# Patient Record
Sex: Male | Born: 1955 | Race: Black or African American | Hispanic: No | Marital: Married | Smoking: Never smoker
Health system: Southern US, Community
[De-identification: ages and names within clinical notes are randomized; demographics above are authoritative.]

## PROBLEM LIST (undated history)

## (undated) DIAGNOSIS — I219 Acute myocardial infarction, unspecified: Secondary | ICD-10-CM

## (undated) DIAGNOSIS — R7401 Elevation of levels of liver transaminase levels: Secondary | ICD-10-CM

## (undated) DIAGNOSIS — H409 Unspecified glaucoma: Secondary | ICD-10-CM

## (undated) DIAGNOSIS — R740 Nonspecific elevation of levels of transaminase and lactic acid dehydrogenase [LDH]: Secondary | ICD-10-CM

## (undated) HISTORY — PX: CHOLECYSTECTOMY: SHX55

## (undated) HISTORY — DX: Nonspecific elevation of levels of transaminase and lactic acid dehydrogenase (ldh): R74.0

## (undated) HISTORY — DX: Elevation of levels of liver transaminase levels: R74.01

---

## 2006-02-03 ENCOUNTER — Ambulatory Visit (HOSPITAL_COMMUNITY): Admission: RE | Admit: 2006-02-03 | Discharge: 2006-02-04 | Payer: Self-pay | Admitting: Neurosurgery

## 2010-05-05 ENCOUNTER — Ambulatory Visit: Payer: Self-pay | Admitting: Diagnostic Radiology

## 2010-05-05 ENCOUNTER — Inpatient Hospital Stay (HOSPITAL_COMMUNITY): Admission: AD | Admit: 2010-05-05 | Discharge: 2010-05-09 | Payer: Self-pay | Admitting: Internal Medicine

## 2010-05-05 ENCOUNTER — Encounter: Payer: Self-pay | Admitting: Emergency Medicine

## 2011-01-14 IMAGING — CT CT ABDOMEN W/ CM
2 of 4 series · 17 of 46 positions shown, 19 images · IV contrast (agent unspecified)
Comparison: None.

CLINICAL DATA: Vomiting.  Anorexia.  Fever.  Elevated liver
function tests.

CT ABDOMEN WITH CONTRAST
TECHNIQUE: Multidetector CT imaging of the abdomen was performed
using the standard protocol following bolus administration of
intravenous contrast.
Contrast: 100 ml Mmnipaque-YEE and oral contrast

[Series 2: rtn a/p with · axial · 0.69mm/px · z∈[+978,+1258]mm · 14 of 62 slices shown, 16 images]
[im 3/62  soft-tissue]
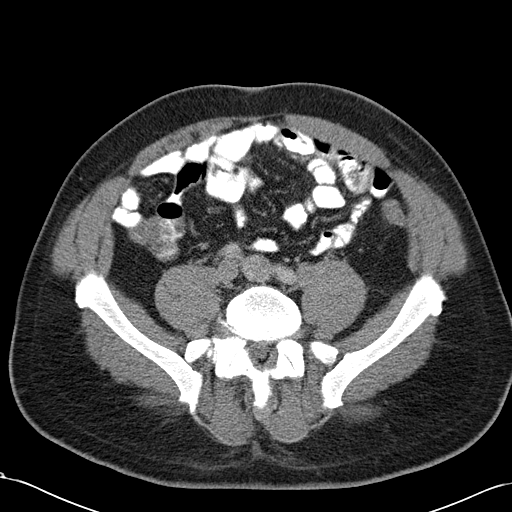
[im 3/62  bone]
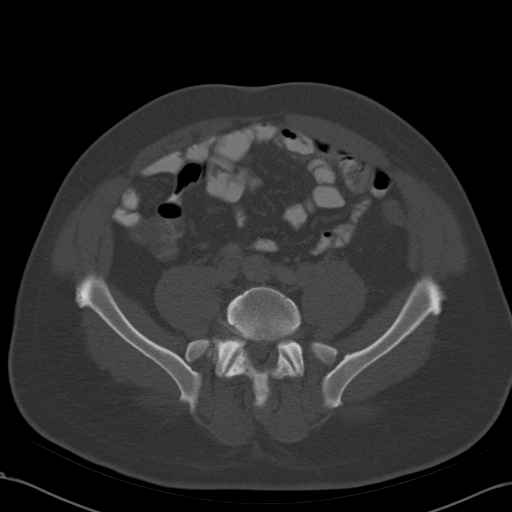
[im 9/62  soft-tissue]
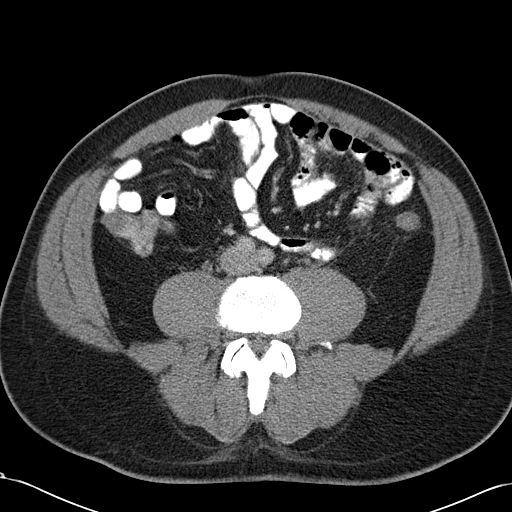
[im 12/62  soft-tissue]
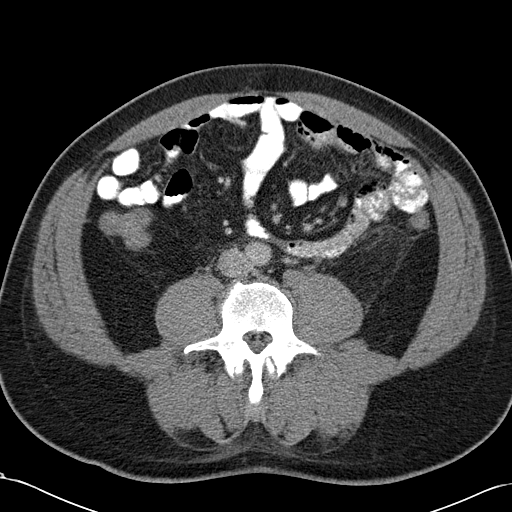
[im 18/62  soft-tissue]
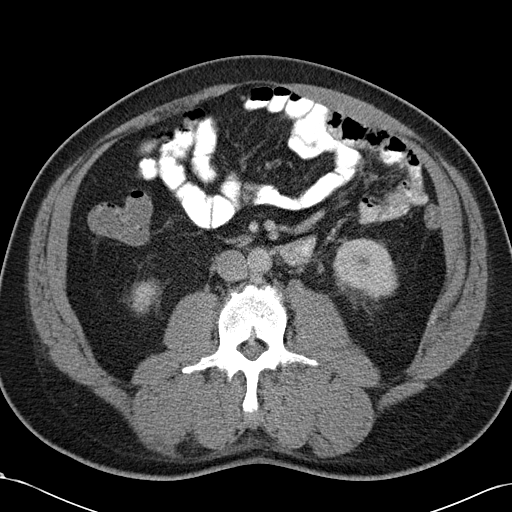
[im 21/62  soft-tissue]
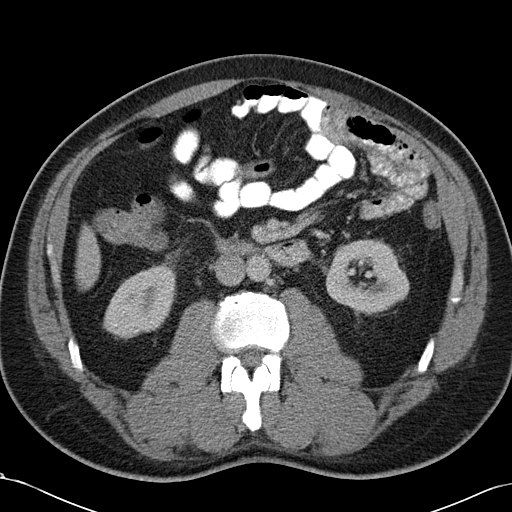
[im 24/62  soft-tissue]
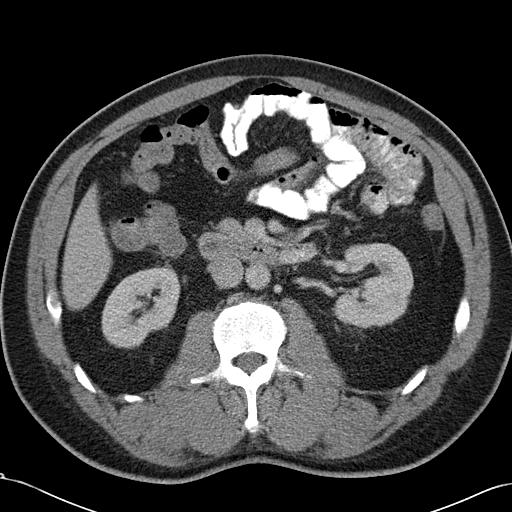
[im 30/62  soft-tissue]
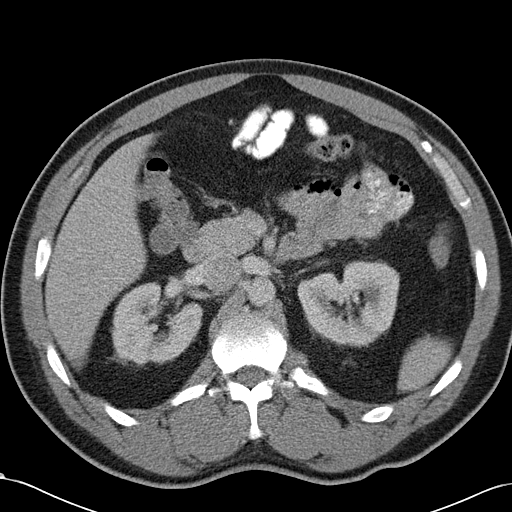
[im 32/62  soft-tissue]
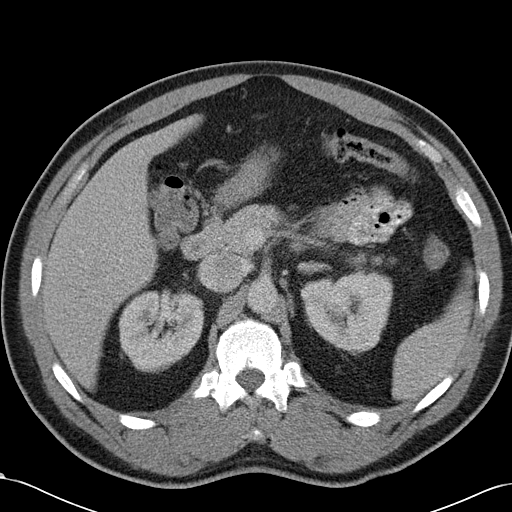
[im 38/62  soft-tissue]
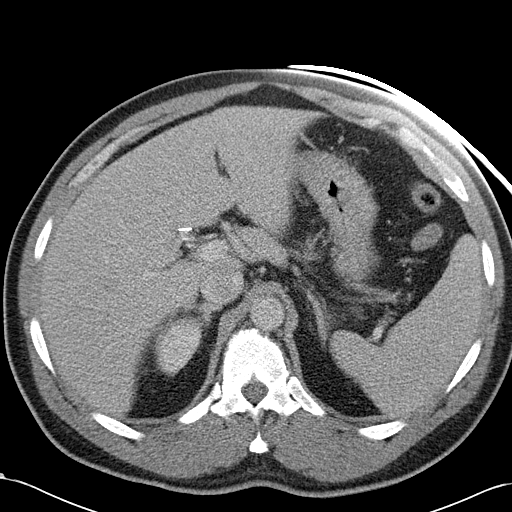
[im 38/62  bone]
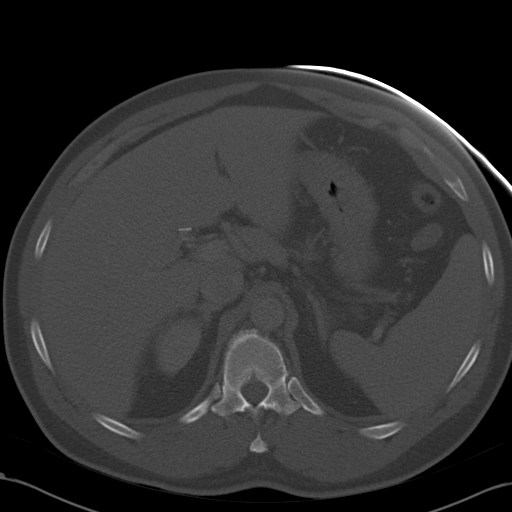
[im 41/62  soft-tissue]
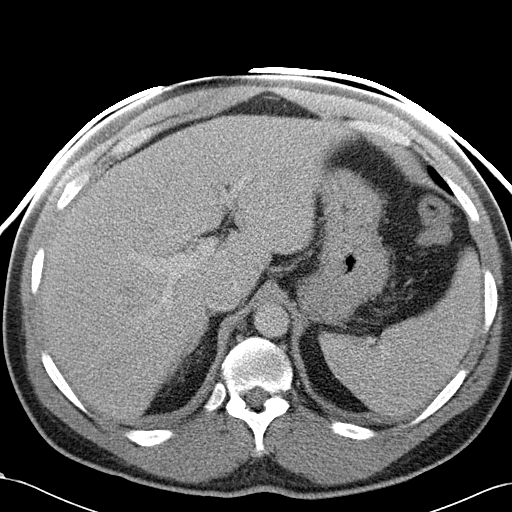
[im 47/62  soft-tissue]
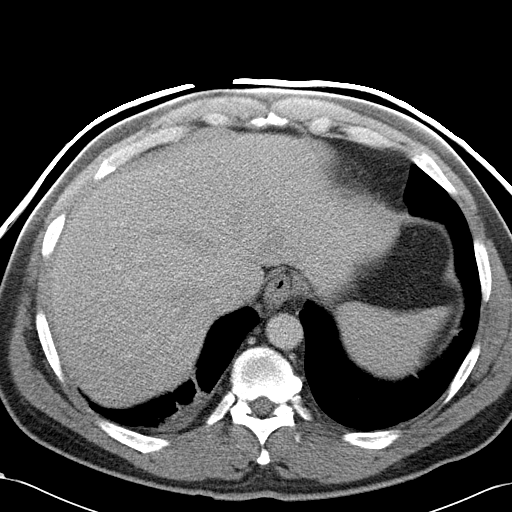
[im 50/62  soft-tissue]
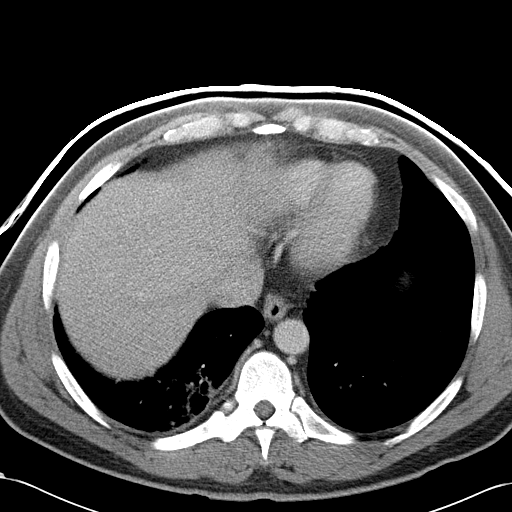
[im 53/62  soft-tissue]
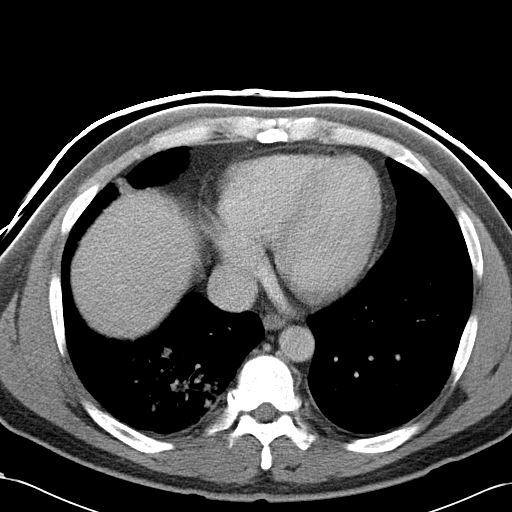
[im 59/62  soft-tissue]
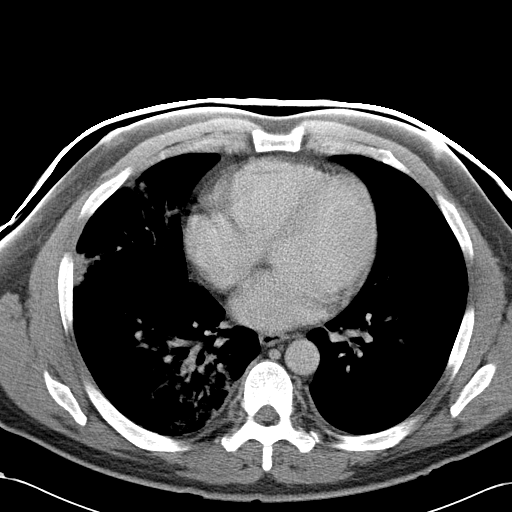

[Series 602: <mpr thick range> · coronal · 0.69mm/px · 3 of 98 slices shown]
[im 33/98  soft-tissue]
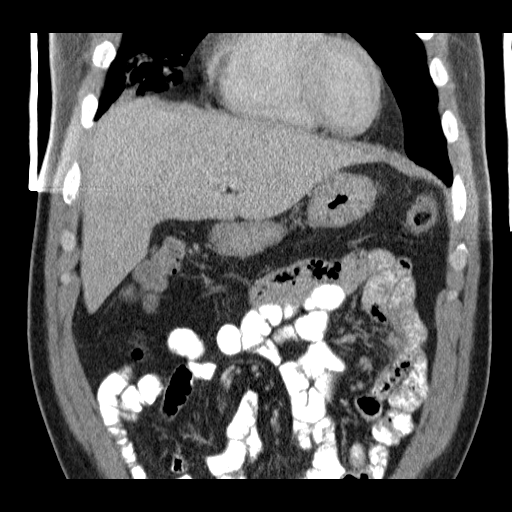
[im 44/98  soft-tissue]
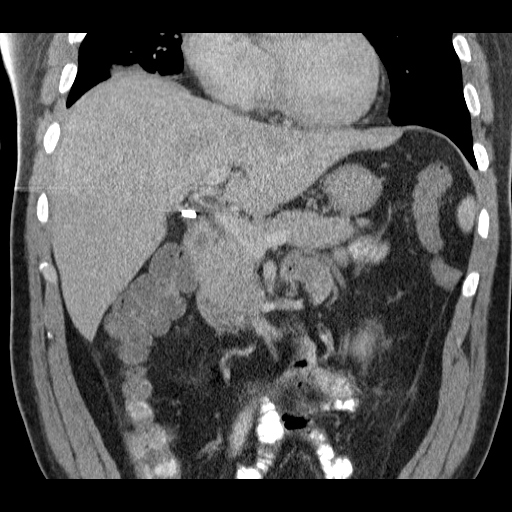
[im 54/98  soft-tissue]
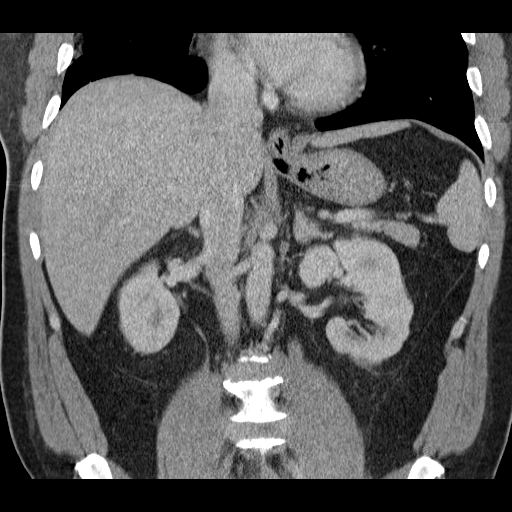

[17 of 46 positions shown; findings below may reference images not displayed]

FINDINGS: Pulmonary air space disease is seen in the visualized
portions of the right middle and lower lobes, consistent with
pneumonia.

Surgical clips seen from prior cholecystectomy.  No evidence of
liver mass or biliary ductal dilatation.  The pancreas, spleen,
adrenal glands, and kidneys are normal appearance.  There is no
evidence of hydronephrosis.

No soft tissue masses or lymphadenopathy are identified within the
abdomen.  There is no evidence of inflammatory process or abnormal
fluid collections.  Visualized abdominal bowel loops appear normal.
IMPRESSION: 1.  Right middle and lower lobe airspace disease, consistent with
pneumonia.
2.  No acute findings or significant abnormality within the
abdomen.

## 2011-02-09 LAB — COMPREHENSIVE METABOLIC PANEL
Alkaline Phosphatase: 286 U/L — ABNORMAL HIGH (ref 39–117)
Chloride: 108 mEq/L (ref 96–112)
GFR calc Af Amer: >60 mL/min (ref >60)
GFR calc non Af Amer: >60 mL/min (ref >60)
Potassium: 3.7 mEq/L (ref 3.5–5.1)
Total Protein: 6.6 g/dL (ref 6.0–8.3)

## 2011-02-09 LAB — BASIC METABOLIC PANEL
BUN: 7 mg/dL (ref 6–23)
CO2: 23 mEq/L (ref 19–32)
Calcium: 8.7 mg/dL (ref 8.4–10.5)
Chloride: 110 mEq/L (ref 96–112)
Creatinine, Ser: 0.94 mg/dL (ref 0.4–1.5)
GFR calc non Af Amer: >60 mL/min (ref >60)
Potassium: 3.8 mEq/L (ref 3.5–5.1)
Sodium: 140 mEq/L (ref 135–145)

## 2011-02-09 LAB — HEPATITIS C ANTIBODY (REFLEX): HCV Ab: NEGATIVE

## 2011-02-09 LAB — DIFFERENTIAL
Basophils Relative: 0 % (ref 0–1)
Eosinophils Absolute: 0.1 10*3/uL (ref 0.0–0.7)
Lymphs Abs: 1.2 10*3/uL (ref 0.7–4.0)
Neutro Abs: 5 10*3/uL (ref 1.7–7.7)
Neutrophils Relative %: 75 % (ref 43–77)

## 2011-02-09 LAB — MAGNESIUM: Magnesium: 2.1 mg/dL (ref 1.5–2.5)

## 2011-02-09 LAB — CBC
MCHC: 34.7 g/dL (ref 30.0–36.0)
MCV: 87.1 fL (ref 78.0–100.0)
RBC: 3.63 MIL/uL — ABNORMAL LOW (ref 4.22–5.81)
RDW: 14 % (ref 11.5–15.5)
RDW: 14.7 % (ref 11.5–15.5)

## 2011-02-10 LAB — CULTURE, BLOOD (ROUTINE X 2)
Culture: NO GROWTH
Culture: NO GROWTH

## 2011-02-10 LAB — CBC
HCT: 34.5 % — ABNORMAL LOW (ref 39.0–52.0)
HCT: 35.3 % — ABNORMAL LOW (ref 39.0–52.0)
Hemoglobin: 11.4 g/dL — ABNORMAL LOW (ref 13.0–17.0)
Hemoglobin: 12 g/dL — ABNORMAL LOW (ref 13.0–17.0)
Hemoglobin: 13.1 g/dL (ref 13.0–17.0)
MCV: 90.6 fL (ref 78.0–100.0)
Platelets: 209 10*3/uL (ref 150–400)
Platelets: 209 10*3/uL (ref 150–400)
RBC: 3.81 MIL/uL — ABNORMAL LOW (ref 4.22–5.81)
RDW: 13.6 % (ref 11.5–15.5)
RDW: 14.7 % (ref 11.5–15.5)
WBC: 3.6 10*3/uL — ABNORMAL LOW (ref 4.0–10.5)
WBC: 7.9 10*3/uL (ref 4.0–10.5)

## 2011-02-10 LAB — COMPREHENSIVE METABOLIC PANEL
ALT: 110 U/L — ABNORMAL HIGH (ref 0–53)
Albumin: 2.4 g/dL — ABNORMAL LOW (ref 3.5–5.2)
Albumin: 2.4 g/dL — ABNORMAL LOW (ref 3.5–5.2)
Alkaline Phosphatase: 246 U/L — ABNORMAL HIGH (ref 39–117)
Alkaline Phosphatase: 281 U/L — ABNORMAL HIGH (ref 39–117)
BUN: 12 mg/dL (ref 6–23)
BUN: 28 mg/dL — ABNORMAL HIGH (ref 6–23)
BUN: 37 mg/dL — ABNORMAL HIGH (ref 6–23)
CO2: 20 mEq/L (ref 19–32)
CO2: 24 mEq/L (ref 19–32)
Chloride: 104 mEq/L (ref 96–112)
Chloride: 110 mEq/L (ref 96–112)
Creatinine, Ser: 0.92 mg/dL (ref 0.4–1.5)
Creatinine, Ser: 2.1 mg/dL — ABNORMAL HIGH (ref 0.4–1.5)
GFR calc non Af Amer: 33 mL/min — ABNORMAL LOW (ref >60)
GFR calc non Af Amer: >60 mL/min (ref >60)
Glucose, Bld: 131 mg/dL — ABNORMAL HIGH (ref 70–99)
Potassium: 3.5 mEq/L (ref 3.5–5.1)
Potassium: 3.6 mEq/L (ref 3.5–5.1)
Potassium: 3.9 mEq/L (ref 3.5–5.1)
Sodium: 136 mEq/L (ref 135–145)
Sodium: 138 mEq/L (ref 135–145)
Total Bilirubin: 0.7 mg/dL (ref 0.3–1.2)
Total Bilirubin: 1.7 mg/dL — ABNORMAL HIGH (ref 0.3–1.2)
Total Protein: 7.3 g/dL (ref 6.0–8.3)

## 2011-02-10 LAB — LEGIONELLA ANTIGEN, URINE

## 2011-02-10 LAB — DIFFERENTIAL
Basophils Relative: 0 % (ref 0–1)
Eosinophils Relative: 0 % (ref 0–5)
Lymphocytes Relative: 8 % — ABNORMAL LOW (ref 12–46)
Lymphs Abs: 0.8 10*3/uL (ref 0.7–4.0)
Monocytes Absolute: 1.3 10*3/uL — ABNORMAL HIGH (ref 0.1–1.0)
Neutro Abs: 7.5 10*3/uL (ref 1.7–7.7)

## 2011-02-10 LAB — RAPID URINE DRUG SCREEN, HOSP PERFORMED
Amphetamines: NOT DETECTED
Cocaine: NOT DETECTED
Opiates: POSITIVE — AB
Tetrahydrocannabinol: NOT DETECTED

## 2011-02-10 LAB — URINALYSIS, ROUTINE W REFLEX MICROSCOPIC
Glucose, UA: NEGATIVE mg/dL
Ketones, ur: NEGATIVE mg/dL
Specific Gravity, Urine: 1.017 (ref 1.005–1.030)

## 2011-02-10 LAB — URINE CULTURE
Colony Count: NO GROWTH
Culture: NO GROWTH

## 2011-02-10 LAB — URINE MICROSCOPIC-ADD ON

## 2011-02-10 LAB — MAGNESIUM: Magnesium: 2.6 mg/dL — ABNORMAL HIGH (ref 1.5–2.5)

## 2011-04-11 NOTE — Op Note (Signed)
NAME:  Nathan Mccormick, Nathan Mccormick               ACCOUNT NO.:  [id number]   MEDICAL RECORD NO.:  [id number]          PATIENT TYPE:  AMB   LOCATION:  SDS                          FACILITY:  [facility]   PHYSICIAN:  Reinaldo Meeker, M.D. DATE OF BIRTH:  1956-09-16   DATE OF PROCEDURE:  02/03/2006  DATE OF DISCHARGE:                                 OPERATIVE REPORT   PREOPERATIVE DIAGNOSIS:  Herniated disk L5-S1, right.   POSTOPERATIVE DIAGNOSIS:  Herniated disk L5-S1, right.   PROCEDURE PERFORMED:  Right L5-S1 intralaminar laminotomy for excision of  herniated disk with operating microscope.   SECONDARY PROCEDURE:  Microdissection L5-S1 disk and S1 nerve root.   SURGEON:  Reinaldo Meeker, M.D.   ASSISTANT:  Kathaleen Maser. Pool, M.D.   ANESTHESIA:  General.   DESCRIPTION OF PROCEDURE:  After being placed in prone position, the  patient's back was prepped and draped in the usual sterile fashion.  Localizing x-ray was taken prior to incision to identify the appropriate  level.  Midline incision was made above the spinous processes of L5 and S1.  Using the Bovie cutting current, the incision was carried down to spinous  processes.  Subperiosteal dissection was then carried out on the right-sided  spinous processes and lamina.  Self-retaining retractor was placed for  exposure.  X-ray showed approach to the appropriate level.  Using the high  speed drill, the inferior one third of the L5 lamina and the medial one  third of the facet joint were removed.  A drill was then used to remove the  superior one half of the S1 lamina.  The residual bone and ligamentum flavum  were removed in piecemeal fashion.  The operating microscope was draped and  brought into the field and used for the remainder of the case.  Using  microdissection technique,  the lateral aspect of the thecal sac and S1  nerve were identified.  Further coagulation was carried out down to the  floor of the canal to identify the L5-S1 disk which  was found to be markedly  herniated.  After coagulating out the annulus, the annulus was incised with  a 15 blade.  Using pituitary rongeurs and curets, a thorough disk space  clean out was carried out.  Inspection inferior to the disk space yielded a  large subligamentous fragment of disk and this was removed in piecemeal  fashion until the nerve root was well decompressed.  Additional disk space  cleanout was then carried out at this time.  Inspection was then carried out  in all directions for any evidence of residual compression and none could be  identified.  Large amounts of irrigation were carried out.  Any bleeding was  controlled with bipolar coagulation and Gelfoam.  The  wound was then closed in multiple layers of Vicryl in the muscle, fascia,  subcutaneous and subcuticular tissues and staples were placed on the skin.  Sterile dressing was then applied and the patient was extubated and taken to  recovery room in stable condition.           ______________________________  Harvie Heck  Sonda Primes, M.D.     ROK/MEDQ  D:  02/03/2006  T:  02/04/2006  Job:  [id number]

## 2014-10-16 ENCOUNTER — Ambulatory Visit (INDEPENDENT_AMBULATORY_CARE_PROVIDER_SITE_OTHER): Payer: BC Managed Care – PPO | Admitting: Physician Assistant

## 2014-10-16 VITALS — BP 124/70 | HR 65 | Temp 97.5°F | Resp 18 | Ht 70.0 in | Wt 226.0 lb

## 2014-10-16 DIAGNOSIS — L918 Other hypertrophic disorders of the skin: Secondary | ICD-10-CM

## 2014-10-16 DIAGNOSIS — Z7251 High risk heterosexual behavior: Secondary | ICD-10-CM

## 2014-10-16 MED ORDER — IBUPROFEN 800 MG PO TABS: 800.0000 mg | ORAL_TABLET | Freq: Three times a day (TID) | ORAL | Status: DC | PRN

## 2014-10-16 NOTE — Patient Instructions (Addendum)
Take Ibuprofen 800mg  with food once every 8 hours for pain. Use Neosporin over-the-counter for the next 5 days.  Biopsy Care After Refer to this sheet in the next few weeks. These instructions provide you with information on caring for yourself after your procedure. Your caregiver may also give you more specific instructions. Your treatment has been planned according to current medical practices, but problems sometimes occur. Call your caregiver if you have any problems or questions after your procedure. If you had a fine needle biopsy, you may have soreness at the biopsy site for 1 to 2 days. If you had an open biopsy, you may have soreness at the biopsy site for 3 to 4 days. HOME CARE INSTRUCTIONS   You may resume normal diet and activities as directed.  Change bandages (dressings) as directed. If your wound was closed with a skin glue (adhesive), it will wear off and begin to peel in 7 days.  Only take over-the-counter or prescription medicines for pain, discomfort, or fever as directed by your caregiver.  Ask your caregiver when you can bathe and get your wound wet. SEEK IMMEDIATE MEDICAL CARE IF:   You have increased bleeding (more than a small spot) from the biopsy site.  You notice redness, swelling, or increasing pain at the biopsy site.  You have pus coming from the biopsy site.  You have a fever.  You notice a bad smell coming from the biopsy site or dressing.  You have a rash, have difficulty breathing, or have any allergic problems. MAKE SURE YOU:   Understand these instructions.  Will watch your condition.  Will get help right away if you are not doing well or get worse. Document Released: 05/30/2005 Document Revised: 02/02/2012 Document Reviewed: 05/08/2011 [facility] Patient Information 2015 [employer] [place], [place]. This information is not intended to replace advice given to you by your health care provider. Make sure you discuss any questions you have with your health care  provider.

## 2014-10-16 NOTE — Progress Notes (Signed)
    MRN: [id number] DOB: [birth year]  Subjective:   Nathan Mccormick is a 58 y.o. male presenting for STI check and skin tag removal.  STI - patient had extramarital sex four days ago and condom broke. Has not had sex with his wife since. Currently denies penile lesions or pain, discharge, bleeding, swelling, rashes, dysuria, hematuria, fevers, n/v, abdominal pain. States that he regularly has extramarital affairs but always uses condom, since the condom broke this past time, he would like to have STI check before having sex with his wife.  Skin tag - reports that he has had a growth on torso just below his left armpit. Cannot recall how long it has been there but believes it's been more than 1 year. Denies pain, edema, erythema, discharge, bleeding. Denies history of skin cancer.   Denies any other aggravating or relieving factors, no other questions or concerns.  No Known Allergies  ROS As in subjective.   Objective:   Vitals: BP 124/70 mmHg  Pulse 65  Temp(Src) 97.5 F (36.4 C) (Oral)  Resp 18  Ht 5\' 10"  (1.778 m)  Wt 226 lb (102.513 kg)  BMI 32.43 kg/m2  SpO2 97%  Physical Exam  Constitutional: He is oriented to person, place, and time and well-developed, well-nourished, and in no distress. No distress.  Cardiovascular: Normal rate.   Pulmonary/Chest: Effort normal.  Neurological: He is alert and oriented to person, place, and time.  Skin: Skin is warm and dry. Lesion (skin tag with narrow stalk ~1.5 in diameter left lateral torso just inferior to axilla, no edema, erythema, drainage) noted. No rash noted. He is not diaphoretic. No erythema.   Verbal consent obtain from the patient. Skin cleansed with alcohol pad, then local anesthesia with 1cc 2% lidocaine with epinephrine.  1cm shave of skin tag performed. Specimen was not sent for pathology review.  Hemostasis achieved with drysol and pressure. Small dressing applied.  Local wound care reviewed.  Assessment and Plan :    1. High risk sexual behavior - Advised not have sexual intercourse again until lab results. Will call to report NOT mail results due to patient's request for privacy. - RPR - HIV antibody - GC/Chlamydia Probe Amp  2. Skin tag - Counseled on wound care - Return to clinic if symptoms worsen, fail to resolve or as needed   Wallis BambergMario Lacole Komorowski, PA-C [facility] [phone] 10/16/2014 2:49 PM

## 2014-10-16 NOTE — Progress Notes (Signed)
I have discussed this case with Mr. Mani, PA-C, directly supervised the procedure and agree.  

## 2014-10-17 LAB — HIV ANTIBODY (ROUTINE TESTING W REFLEX): HIV: NONREACTIVE

## 2014-10-17 LAB — GC/CHLAMYDIA PROBE AMP
CT PROBE, AMP APTIMA: NEGATIVE
GC Probe RNA: NEGATIVE

## 2014-10-17 LAB — RPR

## 2014-10-20 ENCOUNTER — Telehealth: Payer: Self-pay | Admitting: Urgent Care

## 2014-10-20 NOTE — Telephone Encounter (Signed)
Left message for patient requesting call back to discuss results and follow up.  Wallis BambergMario Luceil Herrin, PA-C [facility] [phone] 10/20/2014  10:18 AM

## 2014-10-21 ENCOUNTER — Telehealth: Payer: Self-pay | Admitting: Radiology

## 2014-10-21 NOTE — Telephone Encounter (Signed)
Called patient back. Left message requesting call back and asked if he could please let staff know whether or not I could leave him a more detailed message including his results. We cannot mail results d/t patient request for privacy so please let him know that he could either provide a time that he could be reached or return to clinic to pick up results.  Wallis BambergMario Salia Cangemi, PA-C [facility] [phone] 10/21/2014  4:41 PM

## 2014-10-21 NOTE — Telephone Encounter (Signed)
Pt returning call saying you called him.

## 2014-10-22 NOTE — Telephone Encounter (Signed)
Called patient again to discuss results since patient requested no mailing. Please let patient know that [facility]. I will try calling him again on Tuesday-Thursday. If he calls back before then, please ask patient to let us know if I can leave his results in his voicemail or if he has a reliable time when I could reach him.  Wallis BambergMario Massiah Longanecker, PA-C [facility] [phone] 10/22/2014  4:46 PM

## 2014-10-23 NOTE — Telephone Encounter (Signed)
Pt called and stated that you can call him on Tuesday anytime.

## 2014-10-24 ENCOUNTER — Encounter: Payer: Self-pay | Admitting: Urgent Care

## 2014-10-24 NOTE — Telephone Encounter (Signed)
Called patient, reviewed results. Patient requested that I send results via mail now despite not wanting them for his privacy initially. Advised patient to schedule physical exam or come to walk in clinic.  Wallis BambergMario Rube Sanchez, PA-C [facility] [phone] 10/24/2014  9:26 AM

## 2014-10-24 NOTE — Telephone Encounter (Signed)
Pt CB to see if he could reach [place] when he first gets in bc he is home from work and will be awake until about 11 am. He reported that Kathlene NovemberMike has been trying to reach him himself and he can call him at [phone]. Kathlene NovemberMike I did see that you had tried to call pt yourself several times, but if you need for me to tell him something I will be happy to.

## 2015-05-21 ENCOUNTER — Other Ambulatory Visit: Payer: Self-pay | Admitting: Urgent Care

## 2019-03-18 ENCOUNTER — Other Ambulatory Visit: Payer: Self-pay

## 2019-03-18 ENCOUNTER — Emergency Department (HOSPITAL_BASED_OUTPATIENT_CLINIC_OR_DEPARTMENT_OTHER)
Admission: EM | Admit: 2019-03-18 | Discharge: 2019-03-19 | Disposition: A | Discharge status: Home / Self Care | Payer: BLUE CROSS/BLUE SHIELD | Attending: Emergency Medicine | Admitting: Emergency Medicine

## 2019-03-18 ENCOUNTER — Encounter (HOSPITAL_BASED_OUTPATIENT_CLINIC_OR_DEPARTMENT_OTHER): Payer: Self-pay | Admitting: Emergency Medicine

## 2019-03-18 DIAGNOSIS — K529 Noninfective gastroenteritis and colitis, unspecified: Secondary | ICD-10-CM | POA: Insufficient documentation

## 2019-03-18 DIAGNOSIS — R1084 Generalized abdominal pain: Secondary | ICD-10-CM | POA: Diagnosis present

## 2019-03-18 HISTORY — DX: Acute myocardial infarction, unspecified: I21.9

## 2019-03-18 HISTORY — DX: Unspecified glaucoma: H40.9

## 2019-03-18 LAB — COMPREHENSIVE METABOLIC PANEL
ALT: 32 U/L (ref 0–44)
AST: 25 U/L (ref 15–41)
Albumin: 4.5 g/dL (ref 3.5–5.0)
Alkaline Phosphatase: 95 U/L (ref 38–126)
Anion gap: 10 (ref 5–15)
BUN: 12 mg/dL (ref 8–23)
CO2: 22 mmol/L (ref 22–32)
Calcium: 9.7 mg/dL (ref 8.9–10.3)
Chloride: 107 mmol/L (ref 98–111)
Creatinine, Ser: 1.18 mg/dL (ref 0.61–1.24)
GFR calc Af Amer: >60 mL/min (ref >60)
GFR calc non Af Amer: >60 mL/min (ref >60)
Glucose, Bld: 135 mg/dL — ABNORMAL HIGH (ref 70–99)
Potassium: 4.1 mmol/L (ref 3.5–5.1)
Sodium: 139 mmol/L (ref 135–145)
Total Bilirubin: 1.3 mg/dL — ABNORMAL HIGH (ref 0.3–1.2)
Total Protein: 8.4 g/dL — ABNORMAL HIGH (ref 6.5–8.1)

## 2019-03-18 LAB — LIPASE, BLOOD: Lipase: 29 U/L (ref 11–51)

## 2019-03-18 LAB — CBC WITH DIFFERENTIAL/PLATELET
Abs Immature Granulocytes: 0.05 10*3/uL (ref 0.00–0.07)
Basophils Absolute: 0 10*3/uL (ref 0.0–0.1)
Basophils Relative: 0 %
Eosinophils Absolute: 0.1 10*3/uL (ref 0.0–0.5)
Eosinophils Relative: 1 %
HCT: 42 % (ref 39.0–52.0)
Hemoglobin: 13.8 g/dL (ref 13.0–17.0)
Immature Granulocytes: 1 %
Lymphocytes Relative: 18 %
Lymphs Abs: 1.6 10*3/uL (ref 0.7–4.0)
MCH: 30 pg (ref 26.0–34.0)
MCHC: 32.9 g/dL (ref 30.0–36.0)
MCV: 91.3 fL (ref 80.0–100.0)
Monocytes Absolute: 0.5 10*3/uL (ref 0.1–1.0)
Monocytes Relative: 5 %
Neutro Abs: 6.5 10*3/uL (ref 1.7–7.7)
Neutrophils Relative %: 75 %
Platelets: 219 10*3/uL (ref 150–400)
RBC: 4.6 MIL/uL (ref 4.22–5.81)
RDW: 13.2 % (ref 11.5–15.5)
WBC: 8.7 10*3/uL (ref 4.0–10.5)
nRBC: 0 % (ref 0.0–0.2)

## 2019-03-18 LAB — OCCULT BLOOD X 1 CARD TO LAB, STOOL: Fecal Occult Bld: NEGATIVE

## 2019-03-18 MED ORDER — ONDANSETRON HCL 4 MG/2ML IJ SOLN
4.0000 mg | Freq: Once | INTRAMUSCULAR | Status: AC | PRN
  Administered 2019-03-18: 4 mg via INTRAVENOUS

## 2019-03-18 MED ORDER — FENTANYL CITRATE (PF) 100 MCG/2ML IJ SOLN
100.0000 ug | Freq: Once | INTRAMUSCULAR | Status: AC
  Administered 2019-03-18: 100 ug via INTRAVENOUS
  Filled 2019-03-18: qty 2

## 2019-03-18 MED ORDER — SODIUM CHLORIDE 0.9 % IV BOLUS
1000.0000 mL | Freq: Once | INTRAVENOUS | Status: AC
Start: 2019-03-18 — End: 2019-03-19
  Administered 2019-03-18: 1000 mL via INTRAVENOUS

## 2019-03-18 MED ORDER — ONDANSETRON HCL 4 MG/2ML IJ SOLN
INTRAMUSCULAR | Status: AC
  Filled 2019-03-18: qty 2

## 2019-03-18 NOTE — ED Triage Notes (Signed)
Pt c/o abd pain with vomiting and diarrhea that started yesterday.

## 2019-03-18 NOTE — ED Notes (Signed)
ED Provider at bedside. 

## 2019-03-18 NOTE — ED Provider Notes (Signed)
[facility] Provider Note: [name] [name], MD, FACEP  CSN: [id number] MRN: [id number] ARRIVAL: 03/18/19 at 2305 ROOM: [facility]09/[facility]09   CHIEF COMPLAINT  Abdominal Pain   HISTORY OF PRESENT ILLNESS  03/18/19 11:17 PM Nathan LukesJames E Mccormick is a 63 y.o. male who complains of nausea, vomiting, diarrhea and abdominal pain since this morning.  He rates his abdominal pain is a 10 out of 10 and describes it as diffuse cramping.  He has not had a fever.  Pain is somewhat worse with movement or palpation.  He believes he is passing blood in his stool.  He is also complaining of pain over his left greater trochanter which he attributes to a bad mattress.   Past Medical History:  Diagnosis Date   Glaucoma    MI (myocardial infarction) (HCC)    Transaminitis     Past Surgical History:  Procedure Laterality Date   CHOLECYSTECTOMY      No family history on file.  Social History   Tobacco Use   Smoking status: Never Smoker   Smokeless tobacco: Never Used  Substance Use Topics   Alcohol use: No    Alcohol/week: 0.0 standard drinks   Drug use: No    Prior to Admission medications   Not on File    Allergies Patient has no known allergies.   REVIEW OF SYSTEMS  Negative except as noted here or in the History of Present Illness.   PHYSICAL EXAMINATION  Initial Vital Signs Blood pressure (!) 157/83, pulse (!) 57, temperature 97.6 F (36.4 C), temperature source Oral, resp. rate 20, height 5\' 9"  (1.753 m), weight 99.8 kg, SpO2 100 %.  Examination General: Well-developed, well-nourished male in no acute distress; appearance consistent with age of record HENT: normocephalic; atraumatic Eyes: pupils equal, round and reactive to light; extraocular muscles intact; arcus senilis bilaterally Neck: supple Heart: regular rate and rhythm Lungs: clear to auscultation bilaterally Abdomen: soft; nondistended; diffuse tenderness most prominent in the left upper quadrant; no  masses or hepatosplenomegaly; bowel sounds present Rectal: Normal sphincter tone; no stool or blood on examining glove; sample sent for Hemoccult Extremities: No deformity; full range of motion; pulses normal Neurologic: Awake, alert and oriented; motor function intact in all extremities and symmetric; no facial droop Skin: Warm and dry Psychiatric: Flat affect; moaning   RESULTS  Summary of this visit's results, reviewed by myself:   EKG Interpretation  Date/Time:    Ventricular Rate:    PR Interval:    QRS Duration:   QT Interval:    QTC Calculation:   R Axis:     Text Interpretation:        Laboratory Studies: Results for orders placed or performed during the hospital encounter of 03/18/19 (from the past 24 hour(s))  Lipase, blood     Status: None   Collection Time: 03/18/19 11:21 PM  Result Value Ref Range   Lipase 29 11 - 51 U/L  Comprehensive metabolic panel     Status: Abnormal   Collection Time: 03/18/19 11:21 PM  Result Value Ref Range   Sodium 139 135 - 145 mmol/L   Potassium 4.1 3.5 - 5.1 mmol/L   Chloride 107 98 - 111 mmol/L   CO2 22 22 - 32 mmol/L   Glucose, Bld 135 (H) 70 - 99 mg/dL   BUN 12 8 - 23 mg/dL   Creatinine, Ser 0.341.18 0.61 - 1.24 mg/dL   Calcium 9.7 8.9 - 74.210.3 mg/dL   Total Protein 8.4 (H) 6.5 -  8.1 g/dL   Albumin 4.5 3.5 - 5.0 g/dL   AST 25 15 - 41 U/L   ALT 32 0 - 44 U/L   Alkaline Phosphatase 95 38 - 126 U/L   Total Bilirubin 1.3 (H) 0.3 - 1.2 mg/dL   GFR calc non Af Amer >60 >60 mL/min   GFR calc Af Amer >60 >60 mL/min   Anion gap 10 5 - 15  CBC with Differential     Status: None   Collection Time: 03/18/19 11:21 PM  Result Value Ref Range   WBC 8.7 4.0 - 10.5 K/uL   RBC 4.60 4.22 - 5.81 MIL/uL   Hemoglobin 13.8 13.0 - 17.0 g/dL   HCT 82.9 56.2 - 13.0 %   MCV 91.3 80.0 - 100.0 fL   MCH 30.0 26.0 - 34.0 pg   MCHC 32.9 30.0 - 36.0 g/dL   RDW 86.5 78.4 - 69.6 %   Platelets 219 150 - 400 K/uL   nRBC 0.0 0.0 - 0.2 %   Neutrophils  Relative % 75 %   Neutro Abs 6.5 1.7 - 7.7 K/uL   Lymphocytes Relative 18 %   Lymphs Abs 1.6 0.7 - 4.0 K/uL   Monocytes Relative 5 %   Monocytes Absolute 0.5 0.1 - 1.0 K/uL   Eosinophils Relative 1 %   Eosinophils Absolute 0.1 0.0 - 0.5 K/uL   Basophils Relative 0 %   Basophils Absolute 0.0 0.0 - 0.1 K/uL   Immature Granulocytes 1 %   Abs Immature Granulocytes 0.05 0.00 - 0.07 K/uL  Occult blood card to lab, stool     Status: None   Collection Time: 03/18/19 11:29 PM  Result Value Ref Range   Fecal Occult Bld NEGATIVE NEGATIVE  Urinalysis, Routine w reflex microscopic     Status: Abnormal   Collection Time: 03/19/19 12:48 AM  Result Value Ref Range   Color, Urine YELLOW YELLOW   APPearance CLEAR CLEAR   Specific Gravity, Urine <1.005 (L) 1.005 - 1.030   pH 6.0 5.0 - 8.0   Glucose, UA NEGATIVE NEGATIVE mg/dL   Hgb urine dipstick NEGATIVE NEGATIVE   Bilirubin Urine NEGATIVE NEGATIVE   Ketones, ur NEGATIVE NEGATIVE mg/dL   Protein, ur NEGATIVE NEGATIVE mg/dL   Nitrite NEGATIVE NEGATIVE   Leukocytes,Ua NEGATIVE NEGATIVE   Imaging Studies: Ct Abdomen Pelvis W Contrast  Result Date: 03/19/2019 CLINICAL DATA:  Abdominal pain, vomiting EXAM: CT ABDOMEN AND PELVIS WITH CONTRAST TECHNIQUE: Multidetector CT imaging of the abdomen and pelvis was performed using the standard protocol following bolus administration of intravenous contrast. CONTRAST:  OMNIPAQUE IOHEXOL 300 MG/ML  SOLN COMPARISON:  05/07/2010 FINDINGS: Lower chest: Bibasilar dependent atelectasis.  No acute abnormality. Hepatobiliary: No focal liver abnormality is seen. Status post cholecystectomy. No biliary dilatation. Pancreas: No focal abnormality or ductal dilatation. Spleen: No focal abnormality.  Normal size. Adrenals/Urinary Tract: No adrenal abnormality. No focal renal abnormality. No stones or hydronephrosis. Urinary bladder is unremarkable. Stomach/Bowel: Stomach, large and small bowel grossly unremarkable.  Appendix not definitively seen. No inflammatory process in the right lower quadrant. Vascular/Lymphatic: No evidence of aneurysm or adenopathy. Reproductive: No visible focal abnormality. Other: No free fluid or free air. Left inguinal hernia containing fat. Musculoskeletal: Degenerative changes in the lumbar spine. No acute bony abnormality. IMPRESSION: No acute findings in the abdomen or pelvis. Left inguinal hernia containing fat. Electronically Signed   By: Charlett Nose M.D.   On: 03/19/2019 00:25    ED COURSE and MDM  Nursing notes and initial vitals signs, including pulse oximetry, reviewed.  Vitals:   03/18/19 2312 03/18/19 2313  BP:  (!) 157/83  Pulse:  (!) 57  Resp:  20  Temp:  97.6 F (36.4 C)  TempSrc:  Oral  SpO2:  100%  Weight: 99.8 kg   Height: 5\' 9"  (1.753 m)    1:12 AM Patient feeling better after IV fluids and medication.  Abdomen soft and nondistended.  CT scan and laboratory studies reassuring.  Symptoms are consistent with gastroenteritis.  We will treat with Zofran and Bentyl.   PROCEDURES    ED DIAGNOSES     ICD-10-CM   1. Gastroenteritis K52.9        Haidy Kackley, MD 03/19/19 [phone]

## 2019-03-19 ENCOUNTER — Emergency Department (HOSPITAL_BASED_OUTPATIENT_CLINIC_OR_DEPARTMENT_OTHER): Payer: BLUE CROSS/BLUE SHIELD

## 2019-03-19 LAB — URINALYSIS, ROUTINE W REFLEX MICROSCOPIC
Bilirubin Urine: NEGATIVE
Glucose, UA: NEGATIVE mg/dL
Hgb urine dipstick: NEGATIVE
Ketones, ur: NEGATIVE mg/dL
Leukocytes,Ua: NEGATIVE
Nitrite: NEGATIVE
Protein, ur: NEGATIVE mg/dL
Specific Gravity, Urine: <1.005 — ABNORMAL LOW (ref 1.005–1.030)
pH: 6 (ref 5.0–8.0)

## 2019-03-19 MED ORDER — DICYCLOMINE HCL 10 MG/ML IM SOLN
20.0000 mg | Freq: Once | INTRAMUSCULAR | Status: AC
Start: 2019-03-19 — End: 2019-03-19
  Administered 2019-03-19: 20 mg via INTRAMUSCULAR
  Filled 2019-03-19: qty 2

## 2019-03-19 MED ORDER — DICYCLOMINE HCL 20 MG PO TABS: 20.0000 mg | ORAL_TABLET | Freq: Four times a day (QID) | ORAL | 0 refills | Status: AC | PRN

## 2019-03-19 MED ORDER — IOHEXOL 300 MG/ML  SOLN
100.0000 mL | Freq: Once | INTRAMUSCULAR | Status: AC | PRN
  Administered 2019-03-19: 100 mL via INTRAVENOUS

## 2019-03-19 MED ORDER — ONDANSETRON 8 MG PO TBDP: 8.0000 mg | ORAL_TABLET | Freq: Three times a day (TID) | ORAL | 0 refills | Status: AC | PRN

## 2019-03-19 NOTE — ED Notes (Signed)
Pt. Reports he came in due to abd. Pain in the mid abd. And L hip pain poss. From the way he slept.  Pt. Has had pain meds and nausea meds since being here and is feeling relief form nausea and from pain.  Pt. Reports he had vomiting and diarrhea that started at approx. 4pm Friday  too many emesis and diarrhea episodes to count.

## 2019-03-19 NOTE — ED Notes (Signed)
Pt understood dc material. NAD noted. Scripts given at dc. All questions answered to satisfaction. Pt escorted to check out counter. 

## 2019-11-26 IMAGING — CT CT ABDOMEN AND PELVIS WITH CONTRAST
2 of 5 series · 16 of 46 positions shown, 18 images · IV contrast (APPLIED)
Comparison: 05/07/2010

CLINICAL DATA: Abdominal pain, vomiting

EXAM:
CT ABDOMEN AND PELVIS WITH CONTRAST
TECHNIQUE: Multidetector CT imaging of the abdomen and pelvis was performed
using the standard protocol following bolus administration of
intravenous contrast.
CONTRAST:  100mL OMNIPAQUE IOHEXOL 300 MG/ML  SOLN

[Series 2: axial st · axial · 0.72mm/px · z∈[-440,+10]mm · 13 of 102 slices shown, 15 images]
[im 6/102  soft-tissue]
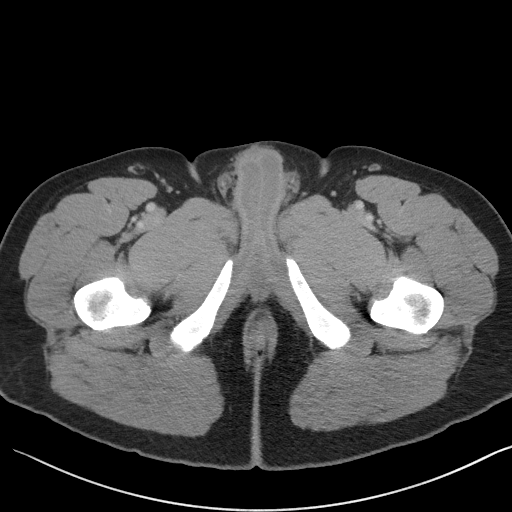
[im 6/102  bone]
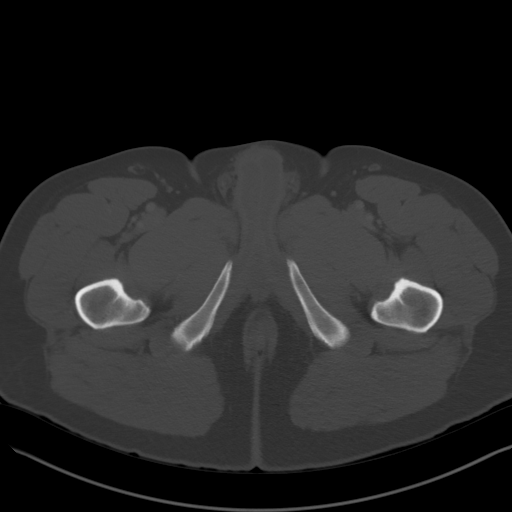
[im 16/102  soft-tissue]
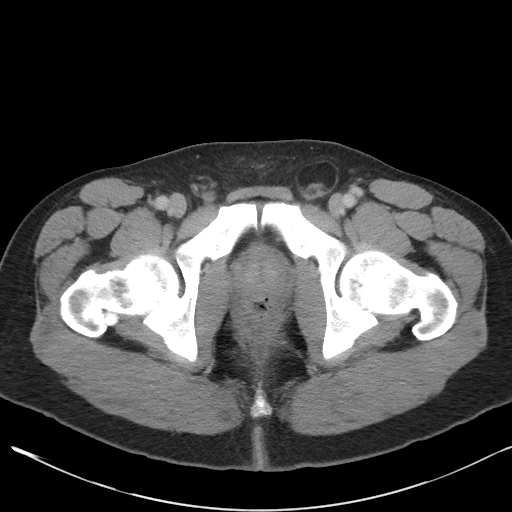
[im 22/102  soft-tissue]
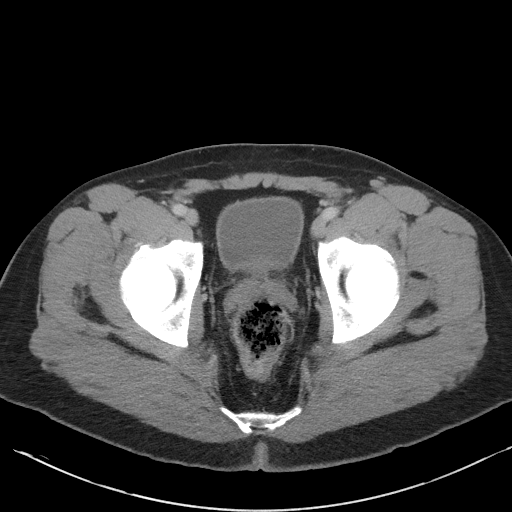
[im 27/102  soft-tissue]
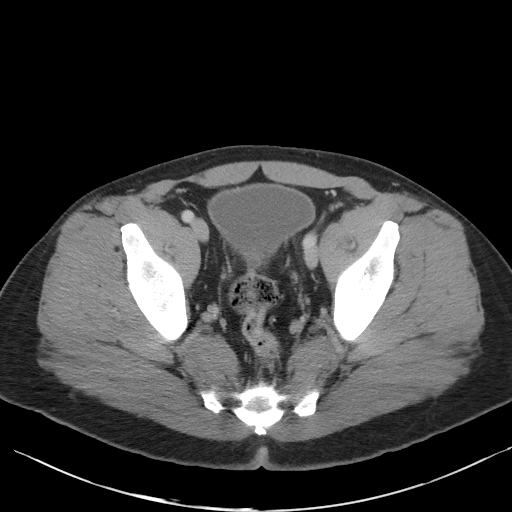
[im 38/102  soft-tissue]
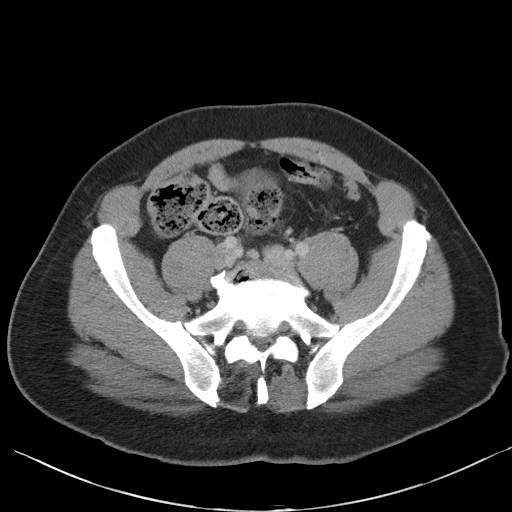
[im 43/102  soft-tissue]
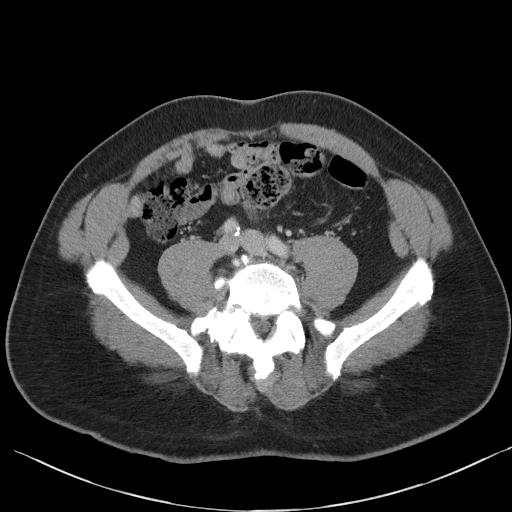
[im 54/102  soft-tissue]
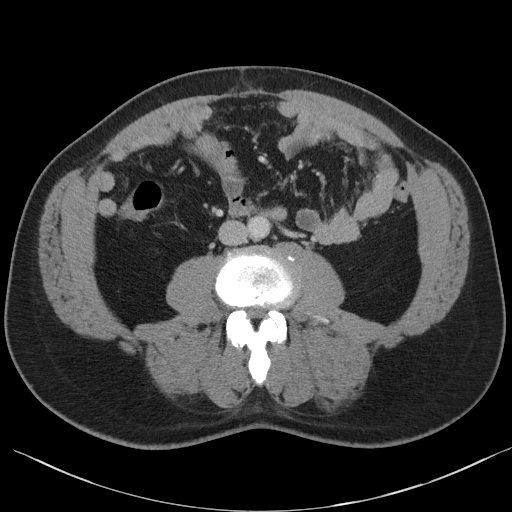
[im 59/102  soft-tissue]
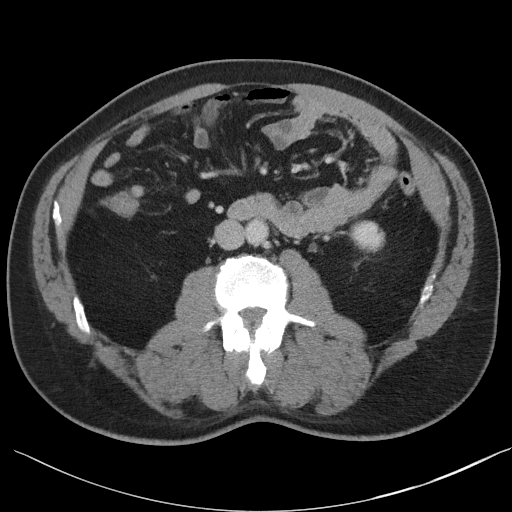
[im 64/102  soft-tissue]
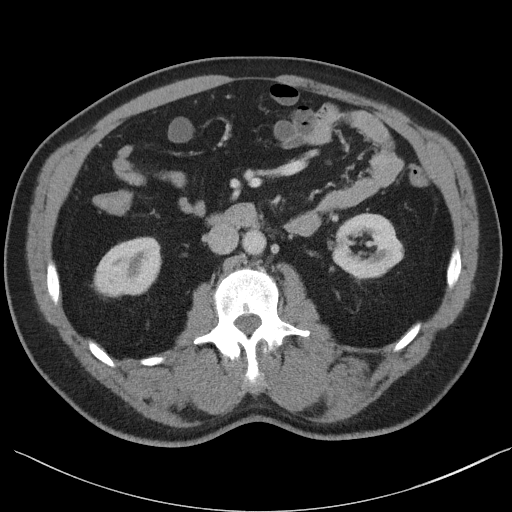
[im 64/102  bone]
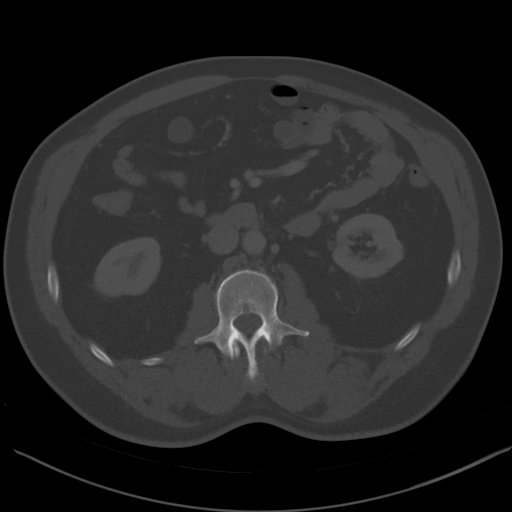
[im 75/102  soft-tissue]
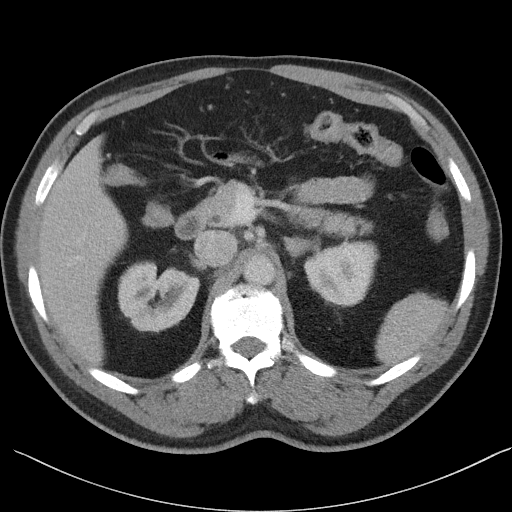
[im 80/102  soft-tissue]
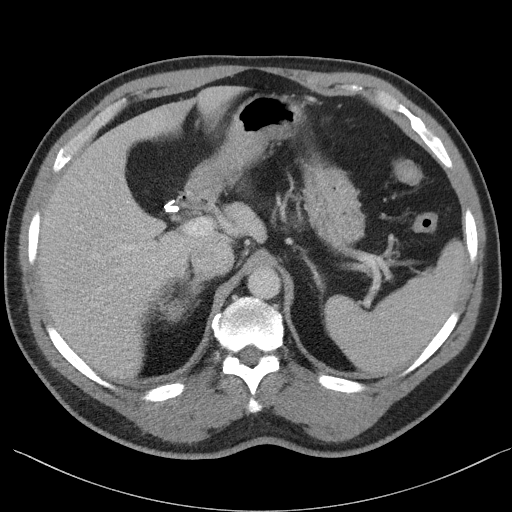
[im 86/102  soft-tissue]
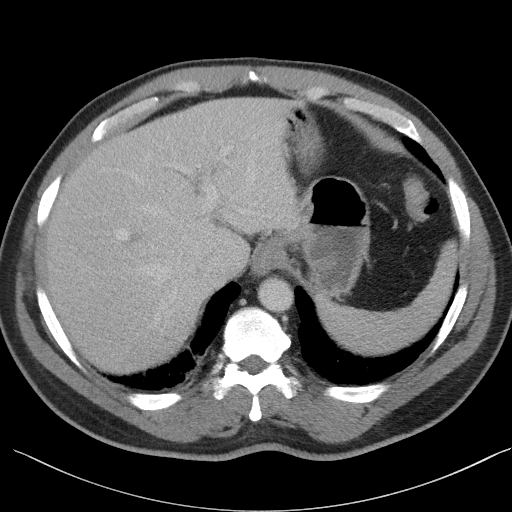
[im 96/102  soft-tissue]
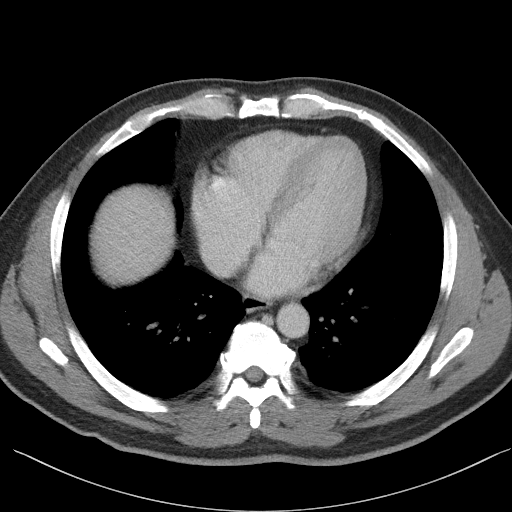

[Series 5: coronal st · coronal · 0.71mm/px · 3 of 101 slices shown]
[im 34/101  soft-tissue]
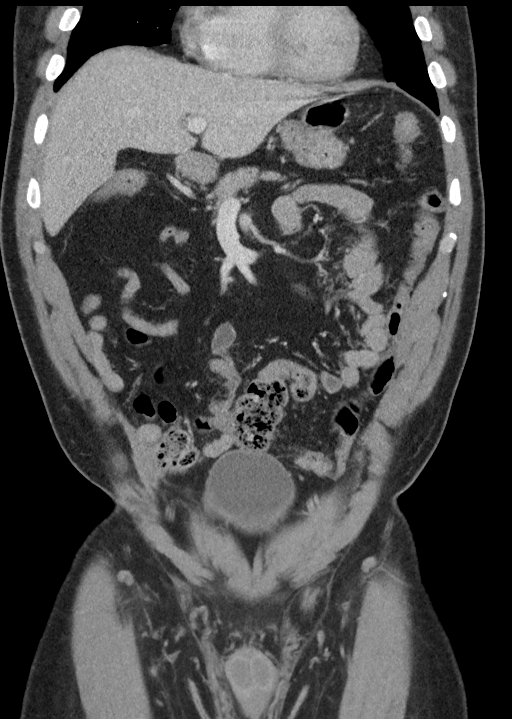
[im 45/101  soft-tissue]
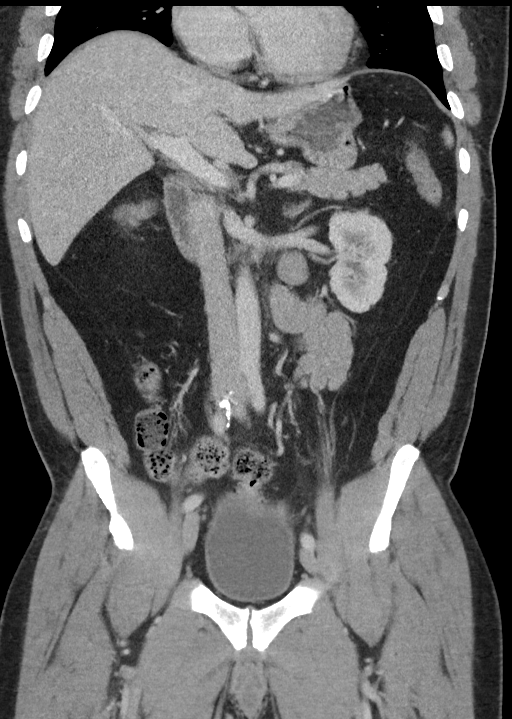
[im 56/101  soft-tissue]
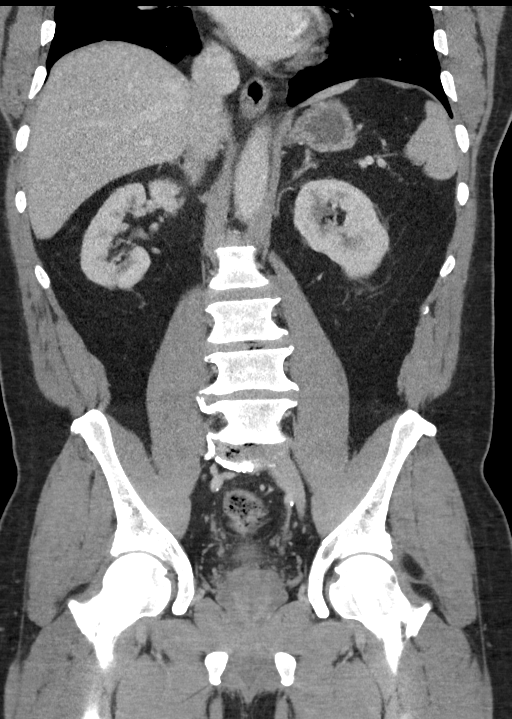

[16 of 46 positions shown; findings below may reference images not displayed]

FINDINGS: Lower chest: Bibasilar dependent atelectasis.  No acute abnormality.

Hepatobiliary: No focal liver abnormality is seen. Status post
cholecystectomy. No biliary dilatation.

Pancreas: No focal abnormality or ductal dilatation.

Spleen: No focal abnormality.  Normal size.

Adrenals/Urinary Tract: No adrenal abnormality. No focal renal
abnormality. No stones or hydronephrosis. Urinary bladder is
unremarkable.

Stomach/Bowel: Stomach, large and small bowel grossly unremarkable.
Appendix not definitively seen. No inflammatory process in the right
lower quadrant.

Vascular/Lymphatic: No evidence of aneurysm or adenopathy.

Reproductive: No visible focal abnormality.

Other: No free fluid or free air. Left inguinal hernia containing
fat.

Musculoskeletal: Degenerative changes in the lumbar spine. No acute
bony abnormality.
IMPRESSION: No acute findings in the abdomen or pelvis.

Left inguinal hernia containing fat.
